# Patient Record
Sex: Male | Born: 2005 | Race: White | Hispanic: Yes | Marital: Single | Smoking: Never smoker
Health system: Southern US, Community
[De-identification: ages and names within clinical notes are randomized; demographics above are authoritative.]

## PROBLEM LIST (undated history)

## (undated) DIAGNOSIS — J45909 Unspecified asthma, uncomplicated: Secondary | ICD-10-CM

## (undated) DIAGNOSIS — T7840XA Allergy, unspecified, initial encounter: Secondary | ICD-10-CM

## (undated) HISTORY — DX: Unspecified asthma, uncomplicated: J45.909

## (undated) HISTORY — DX: Allergy, unspecified, initial encounter: T78.40XA

---
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
## 2024-02-27 ENCOUNTER — Ambulatory Visit (HOSPITAL_COMMUNITY)
Admission: EM | Admit: 2024-02-27 | Discharge: 2024-02-27 | Disposition: A | Discharge status: Home / Self Care | Payer: Self-pay | Attending: Nurse Practitioner | Admitting: Nurse Practitioner

## 2024-02-27 ENCOUNTER — Ambulatory Visit (INDEPENDENT_AMBULATORY_CARE_PROVIDER_SITE_OTHER): Payer: Self-pay

## 2024-02-27 ENCOUNTER — Encounter (HOSPITAL_COMMUNITY): Payer: Self-pay | Admitting: *Deleted

## 2024-02-27 ENCOUNTER — Other Ambulatory Visit: Payer: Self-pay

## 2024-02-27 DIAGNOSIS — T189XXA Foreign body of alimentary tract, part unspecified, initial encounter: Secondary | ICD-10-CM | POA: Diagnosis not present

## 2024-02-27 NOTE — ED Triage Notes (Addendum)
 PT reports he swallowed a cap to a small water bottle. Family reports he was on the floor chocking at first and the heimlich maneuver and the chocking stopped . Pt reports he felt the cap go down his throat. Pt has no SHOB now and is speaking in full sentences. Pt reports pain in chest area.

## 2024-02-27 NOTE — Discharge Instructions (Addendum)
 1. Foreign body ingestion, initial encounter (Primary) - DG Chest 2 View completed in UC shows no obvious foreign body in esophagus or stomach - DG Neck Soft Tissue completed in UC shows no obvious foreign body in upper esophagus or trachea. -Consultation with gastroenterology on-call revealed that patient would more than likely pass object without any problems whatsoever. -If any further concern or escalation of symptoms follow-up in the emergency department for further evaluation and management.

## 2024-02-27 NOTE — ED Provider Notes (Signed)
 [facility]  Note:  This document was prepared using Dragon voice recognition software and may include unintentional dictation errors.  MRN: [id number] DOB: 17-Nov-2006  Subjective:   Luke Perry is a 18 y.o. male presenting for foreign body ingestion.  Patient reports that he excellently swallowed a water bottle.  Mom and dad state that he was on the ground clutching his chest when they got to him father performed Heimlich maneuver which they think may have dislodged from his airway.  Patient still having some discomfort but is breathing comfortably no abdominal pain or stridor.  Patient and parents were concerned that he may not be able to pass the object through his bowel.  Patient is having no distress at this time.  No current facility-administered medications for this encounter.  Current Outpatient Medications:    albuterol (PROVENTIL HFA;VENTOLIN HFA) 108 (90 BASE) MCG/ACT inhaler, Inhale 2 puffs into the lungs every 4 (four) hours as needed for wheezing or shortness of breath., Disp: 1 Inhaler, Rfl: 2   albuterol (PROVENTIL) (2.5 MG/3ML) 0.083% nebulizer solution, Take 3 mLs (2.5 mg total) by nebulization every 6 (six) hours as needed for wheezing or shortness of breath., Disp: 150 mL, Rfl: 1   azithromycin (ZITHROMAX) 200 MG/5ML suspension, Take 10 mL po qd x 1d, then 5 ml po qd d2-5, Disp: 30 mL, Rfl: 0   cetirizine HCl (ZYRTEC) 5 MG/5ML SYRP, Take 10 mLs (10 mg total) by mouth at bedtime., Disp: 236 mL, Rfl: 1   fluticasone (FLOVENT HFA) 44 MCG/ACT inhaler, Inhale 2 puffs into the lungs 2 (two) times daily., Disp: 1 Inhaler, Rfl: 0   montelukast (SINGULAIR) 5 MG chewable tablet, Chew 1 tablet (5 mg total) by mouth at bedtime., Disp: 30 tablet, Rfl: 1   prednisoLONE (ORAPRED) 15 MG/5ML solution, Take 20 mLs (60 mg total) by mouth daily before breakfast., Disp: 60 mL, Rfl: 0   Allergies  Allergen Reactions   Dust Mite Extract    Peanuts [Peanut Oil]    Shrimp  [Shellfish Allergy]     Past Medical History:  Diagnosis Date   Allergy    Asthma      History reviewed. No pertinent surgical history.  History reviewed. No pertinent family history.  Social History   Tobacco Use   Smoking status: Never    ROS Refer to HPI for ROS details.  Objective:   Vitals: BP (!) 129/92   Pulse 66   Temp 99.2 F (37.3 C)   Resp 20   SpO2 98%   Physical Exam Vitals and nursing note reviewed.  Constitutional:      General: He is not in acute distress.    Appearance: Normal appearance. He is well-developed and normal weight. He is not ill-appearing or toxic-appearing.  HENT:     Head: Normocephalic.     Mouth/Throat:     Mouth: Mucous membranes are moist.     Pharynx: Oropharynx is clear. No oropharyngeal exudate or posterior oropharyngeal erythema.  Cardiovascular:     Rate and Rhythm: Normal rate.     Heart sounds: No murmur heard. Pulmonary:     Effort: Pulmonary effort is normal. No respiratory distress.     Breath sounds: No stridor.  Chest:     Chest wall: No tenderness.  Abdominal:     Palpations: Abdomen is soft.     Tenderness: There is no abdominal tenderness. There is no right CVA tenderness, left CVA tenderness, guarding or rebound.  Musculoskeletal:  Cervical back: Neck supple. No rigidity or tenderness.  Skin:    General: Skin is warm and dry.  Neurological:     General: No focal deficit present.     Mental Status: He is alert and oriented to person, place, and time.  Psychiatric:        Mood and Affect: Mood normal.     Procedures  No results found for this or any previous visit (from the past 24 hours).  Assessment and Plan :   PDMP not reviewed this encounter.  1. Foreign body ingestion, initial encounter     1. Foreign body ingestion, initial encounter (Primary) - DG Chest 2 View completed in UC shows no obvious foreign body in esophagus or stomach - DG Neck Soft Tissue completed in UC shows no  obvious foreign body in upper esophagus or trachea. -Consultation with gastroenterology on-call revealed that patient would more than likely pass object without any problems whatsoever. -If any further concern or escalation of symptoms follow-up in the emergency department for further evaluation and management.  Lucky Cowboy   [place], [place], [place] 02/27/24 1907

## 2025-01-05 ENCOUNTER — Ambulatory Visit: Admitting: Student in an Organized Health Care Education/Training Program

## 2025-01-19 ENCOUNTER — Encounter: Payer: Self-pay | Admitting: Student in an Organized Health Care Education/Training Program

## 2025-01-19 ENCOUNTER — Ambulatory Visit: Admitting: Student in an Organized Health Care Education/Training Program

## 2025-01-19 VITALS — BP 110/72 | HR 56 | Temp 98.0°F | Ht 68.0 in | Wt 173.0 lb

## 2025-01-19 DIAGNOSIS — Z0000 Encounter for general adult medical examination without abnormal findings: Secondary | ICD-10-CM | POA: Insufficient documentation

## 2025-01-19 DIAGNOSIS — Z91018 Allergy to other foods: Secondary | ICD-10-CM | POA: Insufficient documentation

## 2025-01-19 DIAGNOSIS — L2089 Other atopic dermatitis: Secondary | ICD-10-CM

## 2025-01-19 DIAGNOSIS — R7989 Other specified abnormal findings of blood chemistry: Secondary | ICD-10-CM | POA: Insufficient documentation

## 2025-01-19 DIAGNOSIS — Z8709 Personal history of other diseases of the respiratory system: Secondary | ICD-10-CM | POA: Insufficient documentation

## 2025-01-19 DIAGNOSIS — L209 Atopic dermatitis, unspecified: Secondary | ICD-10-CM | POA: Insufficient documentation

## 2025-01-19 LAB — CBC
HCT: 48.4 % (ref 39.0–52.0)
Hemoglobin: 16 g/dL (ref 13.0–17.0)
MCHC: 33.1 g/dL (ref 30.0–36.0)
MCV: 90.2 fl (ref 78.0–100.0)
Platelets: 216 10*3/uL (ref 150.0–400.0)
RBC: 5.37 Mil/uL (ref 4.22–5.81)
RDW: 13.8 % (ref 11.5–14.6)
WBC: 6.6 10*3/uL (ref 4.5–10.5)

## 2025-01-19 LAB — BASIC METABOLIC PANEL WITH GFR
BUN: 12 mg/dL (ref 6–23)
CO2: 32 meq/L (ref 19–32)
Calcium: 9.8 mg/dL (ref 8.4–10.5)
Chloride: 101 meq/L (ref 96–112)
Creatinine, Ser: 1.12 mg/dL (ref 0.40–1.50)
GFR: 94.64 mL/min
Glucose, Bld: 99 mg/dL (ref 70–99)
Potassium: 4.3 meq/L (ref 3.5–5.1)
Sodium: 139 meq/L (ref 135–145)

## 2025-01-19 LAB — MICROALBUMIN / CREATININE URINE RATIO
Creatinine,U: 208.2 mg/dL
Microalb Creat Ratio: UNDETERMINED mg/g (ref 0.0–30.0)
Microalb, Ur: <0.7 mg/dL

## 2025-01-19 MED ORDER — EPINEPHRINE 0.3 MG/0.3ML IJ SOAJ: 0.3000 mg | INTRAMUSCULAR | 2 refills | Status: AC | PRN

## 2025-01-19 NOTE — Assessment & Plan Note (Signed)
 Sounds like pretty severe childhood asthma with at least 1 ED visit for asthma exacerbation.  Currently not using bronchodilators, no recent history of exercise-induced asthma.  Will continue to monitor and can add anti-inflammatory reliever therapy in the future if needed.

## 2025-01-19 NOTE — Assessment & Plan Note (Signed)
 Doing very well.  We talked about healthy lifestyle including diet and exercise routines.  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  Adjusting pretty well to the stresses of college life.  Still living at home with his parents.  No issues with mood or substance use.  I offered him STI testing, he declines, not currently sexually active.

## 2025-01-19 NOTE — Patient Instructions (Signed)
" °  VISIT SUMMARY: During your visit, we discussed your history of food allergies, eczema, and asthma. We also addressed your need for an EpiPen  prescription and provided general health maintenance advice.  YOUR PLAN: -FOOD ALLERGY WITH HISTORY OF ANAPHYLAXIS: You have allergies to peanuts, shrimp, dust, and shellfish, and have experienced severe allergic reactions (anaphylaxis) in the past. We discussed how to use the EpiPen  in case of anaphylaxis and provided a prescription for a two-pack EpiPen . Remember to use the EpiPen  early if you experience symptoms of anaphylaxis and seek hospital observation afterward.  -ATOPIC DERMATITIS: You have chronic eczema, which is a condition that causes your skin to become red, itchy, and inflamed. Continue using triamcinolone ointment for flare-ups and only use stronger steroids for severe cases. Be cautious with steroid use to prevent skin thinning and pigment changes.  -TINEA VERSICOLOR: Tinea versicolor is a common fungal infection that causes small patches of discolored skin. It is not harmful. We provided information on how to manage this condition.  -HISTORY OF ASTHMA: You have a history of asthma, which is a condition that affects your airways and can cause difficulty breathing. Although you had severe symptoms in childhood, you are currently asymptomatic.  -GENERAL HEALTH MAINTENANCE: We discussed routine health maintenance. No blood work or STI testing is needed at this time. Schedule follow-up appointments every 1-2 years and use MyChart for any health concerns or questions.  INSTRUCTIONS: Please schedule follow-up appointments every 1-2 years. Use MyChart for any health concerns or questions.    Contains text generated by [employer].   "

## 2025-01-19 NOTE — Assessment & Plan Note (Signed)
 Chronic and stable.  He has allergies to peanuts and shellfish.  He does a good job avoiding these in his diet.  He has used epinephrine  pen 1 time in the past as a child.  Does have a history of symptoms including cutaneous and respiratory symptoms.  We went over the precautions, how to recognize anaphylaxis early, and when to use the epinephrine  pen.  He will continue with avoidance.  I do not think there is much benefit to desensitization in his case.

## 2025-01-19 NOTE — Assessment & Plan Note (Signed)
 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  Will continue this, prefers gel formulation.

## 2025-01-19 NOTE — Assessment & Plan Note (Signed)
 Care everywhere shows a history of an elevated serum creatinine at 1.26 in 2023, no proteinuria at the time.  He reports being told about its comments that he has just been trying to keep up better with drinking water.  No recent exposure to NSAIDs.  Will recheck creatinine along with Cystatin C and urine microalbumin today.  Blood pressure is normal.

## 2025-01-19 NOTE — Progress Notes (Signed)
 "  Complete physical exam  Patient: Luke Martinez-CaicedoMale    DOB: 01-01-2006 18 y.o.   MRN: [id number]  Chief Complaint  Patient presents with   New Patient (Initial Visit)    Patient asking for prescription for epi pen. No concerns to discuss.     Subjective:    Luke Perry is a 19 y.o. male who presents today for a complete physical exam. He reports consuming a general diet. The patient does not participate in regular exercise at present. He generally feels well. He reports sleeping well. He does not have additional problems to discuss today.   Discussed the use of AI scribe software for clinical note transcription with the patient, who gave verbal consent to proceed.  History of Present Illness Luke Perry is a 19 year old male who presents for a general check-up and EpiPen  prescription.  He has a history of food allergies, including peanuts, shrimp, dust, and shellfish. He experienced anaphylaxis a few months ago, characterized by facial swelling and difficulty breathing. He has not seen an allergist and learned about his allergies through trial and error. He avoids nuts and has used an EpiPen  once in high school. He requests a prescription refill for his EpiPen  as his previous one was damaged by his dog.  He has a history of asthma, which he reports having outgrown. As a child, he experienced severe asthma attacks requiring hospital visits and use of a breathing machine. He no longer uses an asthma inhaler regularly.  He has eczema and uses triamcinolone ointment for flare-ups, with a stronger steroid for severe cases. No current need for a refill as he has sufficient supply.  He experiences occasional epistaxis, particularly with rapid temperature changes, which he attributes to dryness.  He is a engineer, manufacturing. He lives at home and reports no significant issues with mood, anxiety, or depression. He engages in  activities like gaming, drawing, and occasional exercise to manage stress. He reports a stable sleep pattern, though occasionally disrupted by late-night activities. He eats two meals a day with snacks in between and limits sweets to once a week. He denies alcohol, drug, or tobacco use.   Most recent fall risk assessment:    01/19/2025    9:50 AM  Fall Risk   Falls in the past year? 0  Number falls in past yr: 0  Injury with Fall? 0  Risk for fall due to : No Fall Risks  Follow up Falls evaluation completed     Most recent depression screenings:    01/19/2025    9:51 AM  PHQ 2/9 Scores  PHQ - 2 Score 0    Patient Care Team: Jerrell Cleatus Ned, MD as PCP - General (Internal Medicine)      Objective:    BP 110/72   Pulse (!) 56   Temp 98 F (36.7 C) (Oral)   Ht 5' 8 (1.727 m)   Wt 173 lb (78.5 kg)   SpO2 98%   BMI 26.30 kg/m   Physical Exam   Gen: Well-appearing young man Ears: Normal tympanic membranes bilaterally Mouth: No oral lesions Neck: Normal thyroid, no nodules or adenopathy Heart: Regular, no murmur Lungs: Unlabored, clear throughout Abd: Soft, nontender Ext: Warm, no edema     Assessment & Plan:    Routine Health Maintenance and Physical Exam  There is no immunization history on file for this patient.  Health Maintenance  Topic Date Due   HPV VACCINES (1 - Risk  male 3-dose series) Never done   HIV Screening  Never done   Hepatitis C Screening  Never done   Influenza Vaccine  07/14/2024   COVID-19 Vaccine (1 - 2025-26 season) 02/04/2025 (Originally 08/14/2024)   DTaP/Tdap/Td (1 - Tdap) 01/19/2026 (Originally 08/26/2023)   Pneumococcal Vaccine (1 of 2 - PCV) 01/19/2026 (Originally 08/26/2023)   Meningococcal B Vaccine (1 of 2 - Standard) 01/19/2026 (Originally 08/25/2020)   Hepatitis B Vaccines 19-59 Average Risk  Completed    Discussed health benefits of physical activity, and encouraged him to engage in regular exercise appropriate for his  age and condition.  Problem List Items Addressed This Visit       Medium    Elevated serum creatinine (Chronic)   Care everywhere shows a history of an elevated serum creatinine at 1.26 in 2023, no proteinuria at the time.  He reports being told about its comments that he has just been trying to keep up better with drinking water.  No recent exposure to NSAIDs.  Will recheck creatinine along with Cystatin C and urine microalbumin today.  Blood pressure is normal.        Relevant Orders   Basic metabolic panel with GFR   CBC   Cystatin C with Glomerular Filtration Rate, Estimated (eGFR)   Microalbumin / creatinine urine ratio   Atopic dermatitis (Chronic)   Chronic and stable Flexeril atopic dermatitis doing well on triamcinolone.  Will continue this, prefers gel formulation.      Food allergy (Chronic)   Chronic and stable.  He has allergies to peanuts and shellfish.  He does a good job avoiding these in his diet.  He has used epinephrine  pen 1 time in the past as a child.  Does have a history of symptoms including cutaneous and respiratory symptoms.  We went over the precautions, how to recognize anaphylaxis early, and when to use the epinephrine  pen.  He will continue with avoidance.  I do not think there is much benefit to desensitization in his case.      Relevant Medications   EPINEPHrine  (EPIPEN  2-PAK) 0.3 mg/0.3 mL IJ SOAJ injection     Low   History of asthma (Chronic)   Sounds like pretty severe childhood asthma with at least 1 ED visit for asthma exacerbation.  Currently not using bronchodilators, no recent history of exercise-induced asthma.  Will continue to monitor and can add anti-inflammatory reliever therapy in the future if needed.      Health maintenance examination - Primary (Chronic)   Doing very well.  We talked about healthy lifestyle including diet and exercise routines.  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  Adjusting pretty well to the stresses of  college life.  Still living at home with his parents.  No issues with mood or substance use.  I offered him STI testing, he declines, not currently sexually active.        Return in about 1 year (around 01/19/2026) for CPE.    Cleatus Debby Specking, MD [facility]    "

## 2026-01-25 ENCOUNTER — Encounter: Admitting: Student in an Organized Health Care Education/Training Program
